# Patient Record
Sex: Male | Born: 1995 | Race: Black or African American | Hispanic: No | Marital: Single | Smoking: Never smoker
Health system: Southern US, Community
[De-identification: ages and names within clinical notes are randomized; demographics above are authoritative.]

## PROBLEM LIST (undated history)

## (undated) DIAGNOSIS — S62609A Fracture of unspecified phalanx of unspecified finger, initial encounter for closed fracture: Secondary | ICD-10-CM

## (undated) DIAGNOSIS — M419 Scoliosis, unspecified: Secondary | ICD-10-CM

## (undated) HISTORY — PX: KNEE SURGERY: SHX244

## (undated) HISTORY — PX: OTHER SURGICAL HISTORY: SHX169

---
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
## 2016-05-14 ENCOUNTER — Encounter (HOSPITAL_COMMUNITY): Payer: Self-pay | Admitting: Emergency Medicine

## 2016-05-14 ENCOUNTER — Emergency Department (HOSPITAL_COMMUNITY)
Admission: EM | Admit: 2016-05-14 | Discharge: 2016-05-14 | Disposition: A | Discharge status: Home / Self Care | Payer: Medicaid Other | Attending: Emergency Medicine | Admitting: Emergency Medicine

## 2016-05-14 DIAGNOSIS — Y999 Unspecified external cause status: Secondary | ICD-10-CM | POA: Diagnosis not present

## 2016-05-14 DIAGNOSIS — M546 Pain in thoracic spine: Secondary | ICD-10-CM | POA: Diagnosis present

## 2016-05-14 DIAGNOSIS — X500XXA Overexertion from strenuous movement or load, initial encounter: Secondary | ICD-10-CM | POA: Diagnosis not present

## 2016-05-14 DIAGNOSIS — M62830 Muscle spasm of back: Secondary | ICD-10-CM

## 2016-05-14 DIAGNOSIS — Y929 Unspecified place or not applicable: Secondary | ICD-10-CM | POA: Insufficient documentation

## 2016-05-14 DIAGNOSIS — Y939 Activity, unspecified: Secondary | ICD-10-CM | POA: Diagnosis not present

## 2016-05-14 DIAGNOSIS — M62838 Other muscle spasm: Secondary | ICD-10-CM | POA: Insufficient documentation

## 2016-05-14 HISTORY — DX: Scoliosis, unspecified: M41.9

## 2016-05-14 MED ORDER — KETOROLAC TROMETHAMINE 30 MG/ML IJ SOLN
30.0000 mg | Freq: Once | INTRAMUSCULAR | Status: AC
  Administered 2016-05-14: 30 mg via INTRAMUSCULAR
  Filled 2016-05-14: qty 1

## 2016-05-14 MED ORDER — CYCLOBENZAPRINE HCL 10 MG PO TABS: 10.0000 mg | ORAL_TABLET | Freq: Two times a day (BID) | ORAL | 0 refills | Status: DC | PRN

## 2016-05-14 NOTE — ED Triage Notes (Signed)
Pt stated that he was carrying a heavy box yesterday and felt a popping sensation in his upper back. Pain is currently in upper back and l/side

## 2016-05-14 NOTE — ED Provider Notes (Signed)
[facility]-EMERGENCY DEPT Provider Note   CSN: [id number] Arrival date & time: 05/14/16  1335  By signing my name below, I, Jonathan Rice, attest that this documentation has been prepared under the direction and in the presence of Jonathan Horsemanobert Vera Furniss, PA-C. Electronically Signed: Sonum Rice, Neurosurgeoncribe. 05/14/16. 2:16 PM.  History   Chief Complaint Chief Complaint  Patient presents with  . Back Pain   The history is provided by the patient. No language interpreter was used.     HPI Comments: Jonathan Rice is a 21 y.o. male who presents to the Emergency Department complaining of constant left sided mid back that began yesterday. He states he was carrying a heavy box when he felt a popping sensation in the affected area. He describes the pain as a sharp, stabbing sensation. He denies extremity numbness or weakness.     Past Medical History:  Diagnosis Date  . Broken finger   . Scoliosis     Patient Active Problem List   Diagnosis Date Noted  . Sports physical 02/19/2011    Past Surgical History:  Procedure Laterality Date  . KNEE SURGERY    . tumor     benign tumor r/thigh       Home Medications    Prior to Admission medications   Medication Sig Start Date End Date Taking? Authorizing Provider  cephALEXin (KEFLEX) 500 MG capsule 2 tabs po bid x 7 days 02/09/12   Viviano SimasLauren Robinson, NP  ibuprofen (ADVIL,MOTRIN) 400 MG tablet Take 1 tablet (400 mg total) by mouth every 6 (six) hours as needed for pain. 01/30/12   Ivonne AndrewPeter Dammen, PA-C    Family History Family History  Problem Relation Age of Onset  . Sudden death Neg Hx   . Heart attack Neg Hx     Social History Social History  Substance Use Topics  . Smoking status: Never Smoker  . Smokeless tobacco: Never Used  . Alcohol use No     Allergies   Patient has no known allergies.   Review of Systems Review of Systems  Musculoskeletal: Positive for back pain.  Neurological: Negative for weakness and numbness.      Physical Exam Updated Vital Signs BP 116/69 (BP Location: Left Arm)   Pulse 79   Temp 98.2 F (36.8 C) (Oral)   Wt 155 lb (70.3 kg)   SpO2 97%   Physical Exam  Constitutional: He is oriented to person, place, and time. He appears well-developed and well-nourished.  HENT:  Head: Normocephalic and atraumatic.  Cardiovascular: Normal rate.   Pulmonary/Chest: Effort normal.  Musculoskeletal: Normal range of motion. He exhibits tenderness. He exhibits no deformity.  Left trapezius and rhomboids tender to palpation with palpable trigger point. ROM and strength of upper extremities 5/5. No C, T, L, S spine tenderness, step offs, or deformity.   Neurological: He is alert and oriented to person, place, and time.  Skin: Skin is warm and dry.  Psychiatric: He has a normal mood and affect.  Nursing note and vitals reviewed.    ED Treatments / Results  DIAGNOSTIC STUDIES: Oxygen Saturation is 97% on RA, nml by my interpretation.    COORDINATION OF CARE: 2:17 PM Discussed treatment plan with pt at bedside and pt agreed to plan.   Labs (all labs ordered are listed, but only abnormal results are displayed) Labs Reviewed - No data to display  EKG  EKG Interpretation None       Radiology No results found.  Procedures Procedures (including critical  care time)  Medications Ordered in ED Medications - No data to display   Initial Impression / Assessment and Plan / ED Course  I have reviewed the triage vital signs and the nursing notes.  Pertinent labs & imaging results that were available during my care of the patient were reviewed by me and considered in my medical decision making (see chart for details).     Patient with back pain.  No neurological deficits and normal neuro exam.  Patient is ambulatory.  No loss of bowel or bladder control.  No concern for cauda equina.  No fever, night sweats, weight loss, h/o cancer, IVDA, no recent procedure to back. No urinary  symptoms suggestive of UTI.  Supportive care and return precaution discussed. Appears safe for discharge at this time. Follow up as indicated in discharge paperwork.    Final Clinical Impressions(s) / ED Diagnoses   Final diagnoses:  Muscle spasm of back    New Prescriptions New Prescriptions   No medications on file   I personally performed the services described in this documentation, which was scribed in my presence. The recorded information has been reviewed and is accurate.      Jonathan Horseman, PA-C 05/14/16 1438    Pricilla Loveless, MD 05/20/16 [phone]

## 2016-07-19 ENCOUNTER — Encounter (HOSPITAL_COMMUNITY): Payer: Self-pay | Admitting: Emergency Medicine

## 2016-07-19 ENCOUNTER — Emergency Department (HOSPITAL_COMMUNITY)
Admission: EM | Admit: 2016-07-19 | Discharge: 2016-07-19 | Disposition: A | Discharge status: Home / Self Care | Payer: Medicaid Other | Attending: Emergency Medicine | Admitting: Emergency Medicine

## 2016-07-19 DIAGNOSIS — Z79899 Other long term (current) drug therapy: Secondary | ICD-10-CM | POA: Insufficient documentation

## 2016-07-19 DIAGNOSIS — L02411 Cutaneous abscess of right axilla: Secondary | ICD-10-CM

## 2016-07-19 MED ORDER — LIDOCAINE-EPINEPHRINE (PF) 2 %-1:200000 IJ SOLN
INTRAMUSCULAR | Status: AC
  Administered 2016-07-19: 21:00:00
  Filled 2016-07-19: qty 20

## 2016-07-19 MED ORDER — LIDOCAINE-EPINEPHRINE 2 %-1:100000 IJ SOLN
20.0000 mL | Freq: Once | INTRAMUSCULAR | Status: DC
  Filled 2016-07-19: qty 20

## 2016-07-19 MED ORDER — CEPHALEXIN 500 MG PO CAPS: 500.0000 mg | ORAL_CAPSULE | Freq: Four times a day (QID) | ORAL | 0 refills | Status: AC

## 2016-07-19 NOTE — ED Provider Notes (Signed)
[facility]-EMERGENCY DEPT Provider Note   CSN: [id number] Arrival date & time: 07/19/16  1906  By signing my name below, I, Rosario AdieWilliam Andrew Hiatt, attest that this documentation has been prepared under the direction and in the presence of [place] Russo, PA-C.  Electronically Signed: Rosario AdieWilliam Andrew Hiatt, ED Scribe. 07/19/16. 8:15 PM.  History   Chief Complaint Chief Complaint  Patient presents with  . Abscess   The history is provided by the patient. No language interpreter was used.    HPI Comments: Jonathan Rice is a 21 y.o. male with no pertinent PMHx, who presents to the Emergency Department complaining of a moderate, gradually worsening area of pain and swelling to the right axillary region onset 2-3 days ago. No drainage from the area. No h/o similar symptoms or prior abscesses. Pt states pain is exacerbated with palpation and direct pressure. No recent changes in soaps, lotions, detergents. No recent illnesses/infections. He denies fever, chills, cough, congestion, sore throat, or any other associated symptoms.   Past Medical History:  Diagnosis Date  . Broken finger   . Scoliosis    Patient Active Problem List   Diagnosis Date Noted  . Sports physical 02/19/2011   Past Surgical History:  Procedure Laterality Date  . KNEE SURGERY    . tumor     benign tumor r/thigh    Home Medications    Prior to Admission medications   Medication Sig Start Date End Date Taking? Authorizing Provider  cephALEXin (KEFLEX) 500 MG capsule Take 1 capsule (500 mg total) by mouth 4 (four) times daily. 07/19/16 07/26/16  [place] Nicole Russo, PA-C  cyclobenzaprine (FLEXERIL) 10 MG tablet Take 1 tablet (10 mg total) by mouth 2 (two) times daily as needed for muscle spasms. 05/14/16   Roxy Horsemanobert Browning, PA-C  ibuprofen (ADVIL,MOTRIN) 400 MG tablet Take 1 tablet (400 mg total) by mouth every 6 (six) hours as needed for pain. 01/30/12   Ivonne AndrewPeter Dammen, PA-C   Family History Family History  Problem Relation Age  of Onset  . Sudden death Neg Hx   . Heart attack Neg Hx    Social History Social History  Substance Use Topics  . Smoking status: Never Smoker  . Smokeless tobacco: Never Used  . Alcohol use No   Allergies   Patient has no known allergies.  Review of Systems Review of Systems  Constitutional: Negative for chills and fever.  HENT: Negative for congestion and sore throat.   Respiratory: Negative for cough.   Musculoskeletal: Negative for myalgias.  Skin: Positive for wound (R axilla, painful).   Physical Exam Updated Vital Signs BP 134/75 (BP Location: Right Arm)   Pulse 77   Temp 98.5 F (36.9 C) (Oral)   Resp 20   Ht 5\' 9"  (1.753 m)   Wt 70.3 kg   SpO2 100%   BMI 22.89 kg/m   Physical Exam  Constitutional: He appears well-developed and well-nourished. No distress.  HENT:  Head: Normocephalic and atraumatic.  Eyes: Conjunctivae are normal.  Neck: Normal range of motion.  Cardiovascular: Normal rate.   Pulmonary/Chest: Effort normal.  Abdominal: He exhibits no distension.  Musculoskeletal: Normal range of motion.  Neurological: He is alert.  Skin: Skin is warm and dry. No pallor.  Right axilla has a 3cm fluctuant erythematous abscess. No purulent drainage. No significant surrounding erythema.   Psychiatric: He has a normal mood and affect. His behavior is normal.  Nursing note and vitals reviewed.  ED Treatments / Results  DIAGNOSTIC STUDIES:  Oxygen Saturation is 100% on RA, normal by my interpretation.   COORDINATION OF CARE: 8:15 PM-Discussed next steps with pt. Pt verbalized understanding and is agreeable with the plan.   Procedures .Marland KitchenIncision and Drainage Date/Time: 07/19/2016 8:17 PM Performed by: RUSSO, [place] NICOLE Authorized by: RUSSO, [place] NICOLE   Consent:    Consent obtained:  Verbal   Consent given by:  Patient   Risks discussed:  Bleeding, infection, incomplete drainage and pain   Alternatives discussed:  No treatment Universal  protocol:    Procedure explained and questions answered to patient or proxy's satisfaction: yes     Relevant documents present and verified: yes     Test results available and properly labeled: yes     Imaging studies available: yes     Required blood products, implants, devices, and special equipment available: yes     Site/side marked: yes     Immediately prior to procedure a time out was called: yes     Patient identity confirmed:  Verbally with patient, arm band and provided demographic data Location:    Type:  Abscess   Location:  Upper extremity   Upper extremity location:  Arm   Arm location: R axilla. Pre-procedure details:    Skin preparation:  Betadine Sedation:    Sedation type: none. Anesthesia (see MAR for exact dosages):    Anesthesia method:  Local infiltration   Local anesthetic:  Lidocaine 2% WITH epi Procedure type:    Complexity:  Simple Procedure details:    Incision types:  Single straight   Incision depth:  Dermal   Scalpel blade:  11   Wound management:  Probed and deloculated   Drainage:  Purulent and bloody   Drainage amount:  Moderate   Wound treatment:  Wound left open   Packing materials:  1/4 in iodoform gauze Post-procedure details:    Patient tolerance of procedure:  Tolerated well, no immediate complications      Medications Ordered in ED Medications  lidocaine-EPINEPHrine (XYLOCAINE W/EPI) 2 %-1:100000 (with pres) injection 20 mL (20 mLs Infiltration Not Given 07/19/16 2050)  lidocaine-EPINEPHrine (XYLOCAINE W/EPI) 2 %-1:200000 (PF) injection (  Given by Other 07/19/16 2050)    Initial Impression / Assessment and Plan / ED Course  I have reviewed the triage vital signs and the nursing notes.  Pertinent labs & imaging results that were available during my care of the patient were reviewed by me and considered in my medical decision making (see chart for details).     Pt is a 21 y.o. male who presents to the Emergency Department with  skin abscess amenable to incision and drainage. No signs of cellulitis surrounding skin. Will discharge home w Keflex.  Discussed results, findings, treatment and follow up. Patient advised of return precautions. Patient verbalized understanding and agreed with plan.   Final Clinical Impressions(s) / ED Diagnoses   Final diagnoses:  Abscess of axilla, right   New Prescriptions New Prescriptions   CEPHALEXIN (KEFLEX) 500 MG CAPSULE    Take 1 capsule (500 mg total) by mouth 4 (four) times daily.   I personally performed the services described in this documentation, which was scribed in my presence. The recorded information has been reviewed and is accurate.    [place] Nicole Russo, PA-C 07/19/16 2136    Rolland Porter, MD 07/28/16 1226

## 2016-07-19 NOTE — Discharge Instructions (Signed)
Please read instructions below. Talk with your primary care provider about any new medications. Follow up with your primary care doctor in 2-3 days for wound re-check and removal of wound packing. Return for ER for fever, or worsening symptoms.

## 2016-07-19 NOTE — ED Notes (Signed)
Using clean technique, covered abscess with telfa pad, gauze and secured with tape. Pt tolerated it well.

## 2016-07-19 NOTE — ED Triage Notes (Signed)
Pt here due to abscess under right axilla. Redness and swelling noted.

## 2016-07-22 ENCOUNTER — Emergency Department (HOSPITAL_COMMUNITY)
Admission: EM | Admit: 2016-07-22 | Discharge: 2016-07-22 | Disposition: A | Discharge status: Home / Self Care | Payer: Self-pay | Attending: Emergency Medicine | Admitting: Emergency Medicine

## 2016-07-22 DIAGNOSIS — Z5189 Encounter for other specified aftercare: Secondary | ICD-10-CM

## 2016-07-22 DIAGNOSIS — Z4801 Encounter for change or removal of surgical wound dressing: Secondary | ICD-10-CM | POA: Insufficient documentation

## 2016-07-22 NOTE — ED Provider Notes (Signed)
[facility]-EMERGENCY DEPT Provider Note   CSN: [id number] Arrival date & time: 07/22/16  1531     History   Chief Complaint Chief Complaint  Patient presents with  . Wound Check    HPI Jonathan Rice is a 21 y.o. male who presents today 3 days s/p I&D of right axilla for wound check. The pt tolerated the procedure well. Has not changed dressing since it was place 3 days ago. Denies bleeding, purrulence, or discomfort. Denies fever/chills, CP/SOB, or pain at this time. Endorses mild soreness at the site. States he is taking the abx he was given as prescribed.   The history is provided by the patient.    Past Medical History:  Diagnosis Date  . Broken finger   . Scoliosis     Patient Active Problem List   Diagnosis Date Noted  . Sports physical 02/19/2011    Past Surgical History:  Procedure Laterality Date  . KNEE SURGERY    . tumor     benign tumor r/thigh       Home Medications    Prior to Admission medications   Medication Sig Start Date End Date Taking? Authorizing Provider  cephALEXin (KEFLEX) 500 MG capsule Take 1 capsule (500 mg total) by mouth 4 (four) times daily. 07/19/16 07/26/16  [place] N Russo, PA-C  cyclobenzaprine (FLEXERIL) 10 MG tablet Take 1 tablet (10 mg total) by mouth 2 (two) times daily as needed for muscle spasms. 05/14/16   Roxy Horsemanobert Browning, PA-C  ibuprofen (ADVIL,MOTRIN) 400 MG tablet Take 1 tablet (400 mg total) by mouth every 6 (six) hours as needed for pain. 01/30/12   Ivonne AndrewPeter Dammen, PA-C    Family History Family History  Problem Relation Age of Onset  . Sudden death Neg Hx   . Heart attack Neg Hx     Social History Social History  Substance Use Topics  . Smoking status: Never Smoker  . Smokeless tobacco: Never Used  . Alcohol use No     Allergies   Patient has no known allergies.   Review of Systems Review of Systems  Constitutional: Negative for chills and fever.  Respiratory: Negative for cough.   Cardiovascular: Negative for  chest pain.  Gastrointestinal: Negative for abdominal pain, diarrhea, nausea and vomiting.  Musculoskeletal: Negative for arthralgias and back pain.  Skin: Negative for rash.     Physical Exam Updated Vital Signs BP (!) 119/55 (BP Location: Right Arm)   Pulse 61   Temp 99 F (37.2 C) (Oral)   Resp 18   SpO2 98%   Physical Exam  Constitutional: He is oriented to person, place, and time. He appears well-developed and well-nourished. No distress.  HENT:  Head: Normocephalic and atraumatic.  Eyes: EOM are normal. Right eye exhibits no discharge. Left eye exhibits no discharge. No scleral icterus.  Neck: Neck supple. No JVD present. No tracheal deviation present.  Cardiovascular: Normal rate.   Pulmonary/Chest: Effort normal.  Abdominal: He exhibits no distension.  Musculoskeletal: Normal range of motion.  Neurological: He is alert and oriented to person, place, and time.  Skin: Skin is warm and dry. He is not diaphoretic.  <1cm well-healing incision on the L axilla, no drainage, purulence, fluctuance, surrounding erythema, mild ttp.   Psychiatric: He has a normal mood and affect. His behavior is normal. Judgment and thought content normal.     ED Treatments / Results  Labs (all labs ordered are listed, but only abnormal results are displayed) Labs Reviewed - No data  to display  EKG  EKG Interpretation None       Radiology No results found.  Procedures Procedures (including critical care time)  Medications Ordered in ED Medications - No data to display   Initial Impression / Assessment and Plan / ED Course  I have reviewed the triage vital signs and the nursing notes.  Pertinent labs & imaging results that were available during my care of the patient were reviewed by me and considered in my medical decision making (see chart for details).     20yom 3 days s/p I&D left axilla presents for wound check. No complications, pt taking abx as prescribed. Appears to be  well-healing, no concern for infection or bleeding. Discussed completing abx course and use of warm compresses for comfort. Discussed ED return precautions.  Pt verbalized understanding and agrees with this plan.   Final Clinical Impressions(s) / ED Diagnoses   Final diagnoses:  Visit for wound check    New Prescriptions Discharge Medication List as of 07/22/2016  4:07 PM       Jeanie Sewer, PA 07/22/16 1630    Lavera Guise, MD 07/23/16 1254

## 2016-07-22 NOTE — ED Triage Notes (Addendum)
Pt here for wound check. Pt had right underarm abscess drained 2 days ago.

## 2016-12-17 ENCOUNTER — Encounter (HOSPITAL_COMMUNITY): Payer: Self-pay | Admitting: *Deleted

## 2016-12-17 ENCOUNTER — Emergency Department (HOSPITAL_COMMUNITY)
Admission: EM | Admit: 2016-12-17 | Discharge: 2016-12-17 | Disposition: A | Discharge status: Home / Self Care | Payer: Self-pay | Attending: Emergency Medicine | Admitting: Emergency Medicine

## 2016-12-17 DIAGNOSIS — W25XXXA Contact with sharp glass, initial encounter: Secondary | ICD-10-CM | POA: Insufficient documentation

## 2016-12-17 DIAGNOSIS — Y9389 Activity, other specified: Secondary | ICD-10-CM | POA: Insufficient documentation

## 2016-12-17 DIAGNOSIS — S61216A Laceration without foreign body of right little finger without damage to nail, initial encounter: Secondary | ICD-10-CM | POA: Insufficient documentation

## 2016-12-17 DIAGNOSIS — M545 Low back pain, unspecified: Secondary | ICD-10-CM

## 2016-12-17 DIAGNOSIS — Y929 Unspecified place or not applicable: Secondary | ICD-10-CM | POA: Insufficient documentation

## 2016-12-17 DIAGNOSIS — Y998 Other external cause status: Secondary | ICD-10-CM | POA: Insufficient documentation

## 2016-12-17 DIAGNOSIS — G8929 Other chronic pain: Secondary | ICD-10-CM | POA: Insufficient documentation

## 2016-12-17 MED ORDER — METHOCARBAMOL 500 MG PO TABS
500.0000 mg | ORAL_TABLET | Freq: Once | ORAL | Status: AC
  Administered 2016-12-17: 500 mg via ORAL
  Filled 2016-12-17: qty 1

## 2016-12-17 MED ORDER — NAPROXEN 250 MG PO TABS
500.0000 mg | ORAL_TABLET | Freq: Once | ORAL | Status: AC
  Administered 2016-12-17: 500 mg via ORAL
  Filled 2016-12-17: qty 2

## 2016-12-17 MED ORDER — METHOCARBAMOL 500 MG PO TABS: 500.0000 mg | ORAL_TABLET | Freq: Every evening | ORAL | 0 refills | Status: DC | PRN

## 2016-12-17 MED ORDER — NAPROXEN 500 MG PO TABS: 500.0000 mg | ORAL_TABLET | Freq: Two times a day (BID) | ORAL | 0 refills | Status: DC

## 2016-12-17 NOTE — ED Notes (Signed)
Patient transported to CT 

## 2016-12-17 NOTE — ED Provider Notes (Signed)
[facility]-EMERGENCY DEPT Provider Note   CSN: [id number] Arrival date & time: 12/17/16  1801     History   Chief Complaint Chief Complaint  Patient presents with  . Laceration    HPI Jonathan Rice is a 21 y.o. male with past medical history significant for scoliosis presenting with right small finger injury while cleaning his car and cutting his finger on some broken glass debris. No bleeding. No loss of sensation, no other injuries. Patient also mentions that he has been having chronic low back pain and was given ibuprofen in the past but it didn't work. He has also tried ice without relief. Denies loss of bowel or bladder function, numbness, weakness, fevers, chills or other symptoms.  HPI  Past Medical History:  Diagnosis Date  . Broken finger   . Scoliosis     Patient Active Problem List   Diagnosis Date Noted  . Sports physical 02/19/2011    Past Surgical History:  Procedure Laterality Date  . KNEE SURGERY    . tumor     benign tumor r/thigh       Home Medications    Prior to Admission medications   Medication Sig Start Date End Date Taking? Authorizing Provider  cyclobenzaprine (FLEXERIL) 10 MG tablet Take 1 tablet (10 mg total) by mouth 2 (two) times daily as needed for muscle spasms. 05/14/16   Roxy Horseman, PA-C  ibuprofen (ADVIL,MOTRIN) 400 MG tablet Take 1 tablet (400 mg total) by mouth every 6 (six) hours as needed for pain. 01/30/12   Ivonne Andrew, PA-C    Family History Family History  Problem Relation Age of Onset  . Sudden death Neg Hx   . Heart attack Neg Hx     Social History Social History  Substance Use Topics  . Smoking status: Never Smoker  . Smokeless tobacco: Never Used  . Alcohol use No     Allergies   Patient has no known allergies.   Review of Systems Review of Systems  Constitutional: Negative for chills and fever.  Respiratory: Negative for cough, shortness of breath, wheezing and stridor.   Cardiovascular: Negative  for chest pain and palpitations.  Gastrointestinal: Negative for abdominal distention, abdominal pain, nausea and vomiting.  Genitourinary: Negative for difficulty urinating, dysuria and hematuria.  Musculoskeletal: Positive for back pain and myalgias. Negative for arthralgias, gait problem, joint swelling, neck pain and neck stiffness.  Skin: Positive for wound. Negative for color change, pallor and rash.  Neurological: Negative for dizziness, seizures, syncope, weakness, light-headedness, numbness and headaches.     Physical Exam Updated Vital Signs BP 129/80 (BP Location: Left Arm)   Pulse 61   Temp 98.2 F (36.8 C) (Oral)   Resp 18   SpO2 98%   Physical Exam  Constitutional: He appears well-developed and well-nourished. No distress.  Afebrile, nontoxic-appearing, sitting comfortably in chair in no acute distress.  HENT:  Head: Normocephalic and atraumatic.  Eyes: Conjunctivae are normal.  Neck: Neck supple.  Cardiovascular: Normal rate, regular rhythm and normal heart sounds.   No murmur heard. Pulmonary/Chest: Effort normal and breath sounds normal. No respiratory distress. He has no wheezes. He has no rales.  Musculoskeletal: Normal range of motion. He exhibits no edema or tenderness.  No midline tenderness palpation of the spine, some tightness and discomfort in the lower lumbar musculature. Full range of motion of all fingers, brisk cap refills sensation intact  Neurological: He is alert. No sensory deficit. He exhibits normal muscle tone.  5 out  of 5 strength in lower extremities bilaterally. Normal stance and gait. Neurovascularly intact in lower extremities bilaterally.  5 out of 5 strength to flexion and extension of all fingers  Skin: Skin is warm and dry. No rash noted. He is not diaphoretic. No erythema. No pallor.  2 mm stellate superficial laceration to the dorsal aspect of the right little finger between the PIP and DIP. No bleeding, Band-Aid applied in triage.    Psychiatric: He has a normal mood and affect.  Nursing note and vitals reviewed.    ED Treatments / Results  Labs (all labs ordered are listed, but only abnormal results are displayed) Labs Reviewed - No data to display  EKG  EKG Interpretation None       Radiology No results found.  Procedures Procedures (including critical care time)  LACERATION REPAIR Performed by: Georgiana Shore Authorized by: Georgiana Shore Consent: Verbal consent obtained. Risks and benefits: risks, benefits and alternatives were discussed Consent given by: patient Patient identity confirmed: provided demographic data Prepped and Draped in normal sterile fashion Wound explored  Laceration Location: right little finger, dorsal, between the PIP and DIP  Laceration Length: 0.3 cm  No Foreign Bodies seen or palpated  Anesthesia: none  Local anesthetic:N/day  Anesthetic total: N/A  Irrigation method: syringe Amount of cleaning: standard  Skin closure: Dermabond  Number of sutures: none  Technique: adhesive  Patient tolerance: Patient tolerated the procedure well with no immediate complications.   Medications Ordered in ED Medications  naproxen (NAPROSYN) tablet 500 mg (not administered)  methocarbamol (ROBAXIN) tablet 500 mg (not administered)     Initial Impression / Assessment and Plan / ED Course  I have reviewed the triage vital signs and the nursing notes.  Pertinent labs & imaging results that were available during my care of the patient were reviewed by me and considered in my medical decision making (see chart for details).     Patient with small superficial laceration to right little finger. No need for sutures. Repaired with Dermabond.  Patient also presents with chronic lower back pain.  No gross neurological deficits and normal neuro exam.  Patient has no gait abnormality or concern for cauda equina.  No loss of bowel or bladder control, fever, night  sweats, weight loss, h/o malignancy, or IVDU.  RICE protocol and pain medications indicated and discussed with patient.    Pressure irrigation performed. Wound explored and base of wound visualized in a bloodless field without evidence of foreign body.  Laceration occurred < 8 hours prior to repair which was well tolerated. Tdap up to date.  Pt has no comorbidities to effect normal wound healing. Pt discharged without antibiotics.    Discussed adhesive closure home care with patient and answered questions. Pt to follow-up for wound check as needed; they are to return to the ED sooner for signs of infection. Pt is hemodynamically stable with no complaints prior to dc.   Discussed strict return precautions and advised to return to the emergency department if experiencing any new or worsening symptoms. Instructions were understood and patient agreed with discharge plan.  Final Clinical Impressions(s) / ED Diagnoses   Final diagnoses:  Laceration of right little finger without foreign body without damage to nail, initial encounter  Chronic bilateral low back pain without sciatica    New Prescriptions New Prescriptions   No medications on file     Gregary Cromer 12/17/16 2218    Tegeler, Canary Brim, MD 12/17/16 2239

## 2016-12-17 NOTE — Discharge Instructions (Signed)
As discussed, the medication prescribed cannot be taken while driving, operating machinery or with alcohol. Take this muscle relaxant at nighttime as needed for muscle spasm. Naproxen twice a day with food. Apply heat pads to your back for comfort.  Follow-up with your primary care provider for management of chronic back pain and related scoliosis.  Return to the ER if you experience loss of sensation in extremity, weakness, loss of bowel or bladder function, fever/chills, night sweats or other concerning symptoms in the meantime.  Monitor for any signs of infection appear little finger and return if you experience swelling, redness, warmth, increased pain. Keep the area clean and dry for at least 24 hours.

## 2016-12-17 NOTE — ED Triage Notes (Signed)
Pt cut his right pinkie on broken glass.  Bleeding controlled and bandaid applied at triage

## 2017-04-29 ENCOUNTER — Other Ambulatory Visit: Payer: Self-pay

## 2017-04-29 ENCOUNTER — Encounter (HOSPITAL_COMMUNITY): Payer: Self-pay

## 2017-04-29 ENCOUNTER — Emergency Department (HOSPITAL_COMMUNITY)
Admission: EM | Admit: 2017-04-29 | Discharge: 2017-04-29 | Disposition: A | Discharge status: Home / Self Care | Payer: Self-pay | Attending: Emergency Medicine | Admitting: Emergency Medicine

## 2017-04-29 DIAGNOSIS — L089 Local infection of the skin and subcutaneous tissue, unspecified: Secondary | ICD-10-CM | POA: Insufficient documentation

## 2017-04-29 DIAGNOSIS — Z5321 Procedure and treatment not carried out due to patient leaving prior to being seen by health care provider: Secondary | ICD-10-CM | POA: Insufficient documentation

## 2017-04-29 NOTE — ED Triage Notes (Signed)
Pt complaining of abscess in L groin. He reports that it is draining white fluid. No fevers. Hx of abscesses. A&Ox4.

## 2017-05-31 ENCOUNTER — Emergency Department (HOSPITAL_COMMUNITY)
Admission: EM | Admit: 2017-05-31 | Discharge: 2017-05-31 | Disposition: A | Discharge status: Home / Self Care | Payer: Self-pay | Attending: Physician Assistant | Admitting: Physician Assistant

## 2017-05-31 ENCOUNTER — Other Ambulatory Visit: Payer: Self-pay

## 2017-05-31 ENCOUNTER — Encounter (HOSPITAL_COMMUNITY): Payer: Self-pay

## 2017-05-31 DIAGNOSIS — Z79899 Other long term (current) drug therapy: Secondary | ICD-10-CM | POA: Insufficient documentation

## 2017-05-31 DIAGNOSIS — L02214 Cutaneous abscess of groin: Secondary | ICD-10-CM | POA: Insufficient documentation

## 2017-05-31 DIAGNOSIS — L0291 Cutaneous abscess, unspecified: Secondary | ICD-10-CM

## 2017-05-31 MED ORDER — ACYCLOVIR 400 MG PO TABS: 400.0000 mg | ORAL_TABLET | Freq: Three times a day (TID) | ORAL | 0 refills | Status: DC

## 2017-05-31 MED ORDER — SULFAMETHOXAZOLE-TRIMETHOPRIM 800-160 MG PO TABS: 1.0000 | ORAL_TABLET | Freq: Two times a day (BID) | ORAL | 0 refills | Status: AC

## 2017-05-31 NOTE — ED Provider Notes (Signed)
[facility] Provider Note   CSN: [id number] Arrival date & time: 05/31/17  1458     History   Chief Complaint Chief Complaint  Patient presents with  . Recurrent Skin Infections    HPI Jonathan Rice is a 22 y.o. male.  HPI   22 year old male presents today with swelling to his right groin.  Patient notes that approximately 3 days ago he developed soreness and swelling to the right groin with a area of hardness.  He notes this feels similar to previous abscesses that he has had in his armpit.  He denies any fever chills nausea or vomiting.  Patient denies any rashes, or itchiness.  She reports he is sexually active.  He denies any urinary complaints.  Past Medical History:  Diagnosis Date  . Broken finger   . Scoliosis     Patient Active Problem List   Diagnosis Date Noted  . Sports physical 02/19/2011    Past Surgical History:  Procedure Laterality Date  . KNEE SURGERY    . tumor     benign tumor r/thigh       Home Medications    Prior to Admission medications   Medication Sig Start Date End Date Taking? Authorizing Provider  acyclovir (ZOVIRAX) 400 MG tablet Take 1 tablet (400 mg total) by mouth 3 (three) times daily. 05/31/17   Joshuajames Moehring, Tinnie GensJeffrey, PA-C  cyclobenzaprine (FLEXERIL) 10 MG tablet Take 1 tablet (10 mg total) by mouth 2 (two) times daily as needed for muscle spasms. 05/14/16   Roxy HorsemanBrowning, Robert, PA-C  ibuprofen (ADVIL,MOTRIN) 400 MG tablet Take 1 tablet (400 mg total) by mouth every 6 (six) hours as needed for pain. 01/30/12   Ivonne Andrewammen, Peter, PA-C  methocarbamol (ROBAXIN) 500 MG tablet Take 1 tablet (500 mg total) by mouth at bedtime as needed for muscle spasms. 12/17/16   Mathews RobinsonsMitchell, Jessica B, PA-C  naproxen (NAPROSYN) 500 MG tablet Take 1 tablet (500 mg total) by mouth 2 (two) times daily with a meal. 12/17/16   Mathews RobinsonsMitchell, Jessica B, PA-C  sulfamethoxazole-trimethoprim (BACTRIM DS,SEPTRA DS) 800-160 MG tablet Take 1 tablet  by mouth 2 (two) times daily for 7 days. 05/31/17 06/07/17  Eyvonne MechanicHedges, Tasheema Perrone, PA-C    Family History Family History  Problem Relation Age of Onset  . Sudden death Neg Hx   . Heart attack Neg Hx     Social History Social History   Tobacco Use  . Smoking status: Never Smoker  . Smokeless tobacco: Never Used  Substance Use Topics  . Alcohol use: No  . Drug use: No     Allergies   Patient has no known allergies.   Review of Systems Review of Systems  All other systems reviewed and are negative.    Physical Exam Updated Vital Signs BP (!) 141/55 (BP Location: Right Arm)   Pulse (!) 57   Temp 98.1 F (36.7 C) (Oral)   Resp 18   SpO2 100%   Physical Exam  Constitutional: He is oriented to person, place, and time. He appears well-developed and well-nourished.  HENT:  Head: Normocephalic and atraumatic.  Eyes: Conjunctivae are normal. Pupils are equal, round, and reactive to light. Right eye exhibits no discharge. Left eye exhibits no discharge. No scleral icterus.  Neck: Normal range of motion. No JVD present. No tracheal deviation present.  Pulmonary/Chest: Effort normal. No stridor.  Genitourinary:  Genitourinary Comments: Small area of induration along the right inguinal region no fluctuance noted, several areas that  appear to be vesicular in nature  Neurological: He is alert and oriented to person, place, and time. Coordination normal.  Psychiatric: He has a normal mood and affect. His behavior is normal. Judgment and thought content normal.  Nursing note and vitals reviewed.    ED Treatments / Results  Labs (all labs ordered are listed, but only abnormal results are displayed) Labs Reviewed - No data to display  EKG  EKG Interpretation None       Radiology No results found.  Procedures Procedures (including critical care time)  Medications Ordered in ED Medications - No data to display   Initial Impression / Assessment and Plan / ED Course  I  have reviewed the triage vital signs and the nursing notes.  Pertinent labs & imaging results that were available during my care of the patient were reviewed by me and considered in my medical decision making (see chart for details).      Final Clinical Impressions(s) / ED Diagnoses   Final diagnoses:  Abscess    Labs:   Imaging:  Consults:  Therapeutics:  Discharge Meds: Bactrim, Cyclovir  Assessment/Plan: 22 year old male presents today with swelling to his right groin.  He does have an area of induration.  He has what appears to be a rash with potentially developing vesicles over the induration but due to hair and skin pigmentation is difficult to assess this.  Patient denies any history of the same.  He has no appreciable abscess.  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  Patient will return immediately with any new or worsening signs or symptoms.  He verbalized understanding and agreement to this plan had no further questions or concerns.      ED Discharge Orders        Ordered    sulfamethoxazole-trimethoprim (BACTRIM DS,SEPTRA DS) 800-160 MG tablet  2 times daily     05/31/17 1651    acyclovir (ZOVIRAX) 400 MG tablet  3 times daily     05/31/17 1654       Eyvonne Mechanic, PA-C 05/31/17 1655    Mackuen, Cindee Salt, MD 06/02/17 1452

## 2017-05-31 NOTE — ED Notes (Signed)
Pt verbalized understanding of medication use and has no further questions. VSS, NAD.

## 2017-05-31 NOTE — ED Triage Notes (Signed)
Pt states he has an abscess to his right groin area. Pt states that it has been there 2 days. No hx of same. No hx of diabetes. Pt afebrile.

## 2017-05-31 NOTE — Discharge Instructions (Signed)
Please read attached information. If you experience any new or worsening signs or symptoms please return to the emergency room for evaluation. Please follow-up with your primary care provider or specialist as discussed. Please use medication prescribed only as directed and discontinue taking if you have any concerning signs or symptoms.   °

## 2017-11-29 ENCOUNTER — Other Ambulatory Visit: Payer: Self-pay

## 2017-11-29 ENCOUNTER — Encounter (HOSPITAL_COMMUNITY): Payer: Self-pay | Admitting: Emergency Medicine

## 2017-11-29 ENCOUNTER — Emergency Department (HOSPITAL_COMMUNITY): Payer: Medicaid Other

## 2017-11-29 ENCOUNTER — Emergency Department (HOSPITAL_COMMUNITY)
Admission: EM | Admit: 2017-11-29 | Discharge: 2017-11-29 | Disposition: A | Discharge status: Home / Self Care | Payer: Medicaid Other | Attending: Emergency Medicine | Admitting: Emergency Medicine

## 2017-11-29 DIAGNOSIS — R0789 Other chest pain: Secondary | ICD-10-CM | POA: Insufficient documentation

## 2017-11-29 DIAGNOSIS — Z79899 Other long term (current) drug therapy: Secondary | ICD-10-CM | POA: Insufficient documentation

## 2017-11-29 LAB — CBC
HCT: 43.7 % (ref 39.0–52.0)
Hemoglobin: 13.8 g/dL (ref 13.0–17.0)
MCH: 27.1 pg (ref 26.0–34.0)
MCHC: 31.6 g/dL (ref 30.0–36.0)
MCV: 85.7 fL (ref 78.0–100.0)
PLATELETS: 314 10*3/uL (ref 150–400)
RBC: 5.1 MIL/uL (ref 4.22–5.81)
RDW: 13.1 % (ref 11.5–15.5)
WBC: 7.4 10*3/uL (ref 4.0–10.5)

## 2017-11-29 LAB — BASIC METABOLIC PANEL
Anion gap: 9 (ref 5–15)
BUN: 9 mg/dL (ref 6–20)
CHLORIDE: 102 mmol/L (ref 98–111)
CO2: 27 mmol/L (ref 22–32)
CREATININE: 1.28 mg/dL — AB (ref 0.61–1.24)
Calcium: 9.1 mg/dL (ref 8.9–10.3)
GFR calc non Af Amer: >60 mL/min (ref >60)
GLUCOSE: 101 mg/dL — AB (ref 70–99)
Potassium: 3.6 mmol/L (ref 3.5–5.1)
SODIUM: 138 mmol/L (ref 135–145)

## 2017-11-29 LAB — I-STAT TROPONIN, ED: Troponin i, poc: 0 ng/mL (ref 0.00–0.08)

## 2017-11-29 MED ORDER — NAPROXEN 500 MG PO TABS: 500.0000 mg | ORAL_TABLET | Freq: Two times a day (BID) | ORAL | 0 refills | Status: DC

## 2017-11-29 NOTE — ED Provider Notes (Signed)
[facility]   CSN: [id number] Arrival date & time: 11/29/17  0214     History   Chief Complaint Chief Complaint  Patient presents with  . Chest Pain    HPI Jonathan Rice is a 22 y.o. male.  HPI Jonathan FlakesJames J Rice is a 22 y.o. male presents to emergency department complaining of right-sided chest pain.  Patient states he was helping his mom move a freezer and remembers feeling sharp pain in the right chest and ribs.  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  He denies any associated shortness of breath.  No dizziness or lightheadedness.  No nausea or vomiting.  No pain or swelling in extremities.  No recent travel.  No recent surgeries.  He states pain is worse when lifting boxes and moving right arm.  It does not radiate.  Did not take anything prior to coming in.  Past Medical History:  Diagnosis Date  . Broken finger   . Scoliosis     Patient Active Problem List   Diagnosis Date Noted  . Sports physical 02/19/2011    Past Surgical History:  Procedure Laterality Date  . KNEE SURGERY    . tumor     benign tumor r/thigh        Home Medications    Prior to Admission medications   Medication Sig Start Date End Date Taking? Authorizing Provider  ibuprofen (ADVIL,MOTRIN) 400 MG tablet Take 1 tablet (400 mg total) by mouth every 6 (six) hours as needed for pain. 01/30/12  Yes Dammen, Theron AristaPeter, PA-C  acyclovir (ZOVIRAX) 400 MG tablet Take 1 tablet (400 mg total) by mouth 3 (three) times daily. Patient not taking: Reported on 11/29/2017 05/31/17   Eyvonne MechanicHedges, Jeffrey, PA-C  cyclobenzaprine (FLEXERIL) 10 MG tablet Take 1 tablet (10 mg total) by mouth 2 (two) times daily as needed for muscle spasms. Patient not taking: Reported on 11/29/2017 05/14/16   Roxy HorsemanBrowning, Robert, PA-C  methocarbamol (ROBAXIN) 500 MG tablet Take 1 tablet (500 mg  total) by mouth at bedtime as needed for muscle spasms. Patient not taking: Reported on 11/29/2017 12/17/16   Mathews RobinsonsMitchell, Jessica B, PA-C  naproxen (NAPROSYN) 500 MG tablet Take 1 tablet (500 mg total) by mouth 2 (two) times daily with a meal. Patient not taking: Reported on 11/29/2017 12/17/16   Gregary CromerMitchell, Jessica B, PA-C    Family History Family History  Problem Relation Age of Onset  . Sudden death Neg Hx   . Heart attack Neg Hx     Social History Social History   Tobacco Use  . Smoking status: Never Smoker  . Smokeless tobacco: Never Used  Substance Use Topics  . Alcohol use: No  . Drug use: No     Allergies   Patient has no known allergies.   Review of Systems Review of Systems  Constitutional: Negative for chills and fever.  Respiratory: Positive for chest tightness. Negative for cough and shortness of breath.   Cardiovascular: Positive for chest pain. Negative for palpitations and leg swelling.  Gastrointestinal: Negative for abdominal distention, abdominal pain, diarrhea, nausea and vomiting.  Musculoskeletal: Negative for arthralgias, myalgias, neck pain and neck stiffness.  Skin: Negative for rash.  Allergic/Immunologic: Negative for immunocompromised state.  Neurological: Negative for dizziness, weakness, light-headedness, numbness and headaches.  All other systems reviewed and are negative.  Physical Exam Updated Vital Signs BP 112/67   Pulse (!) 54   Temp 98.4 F (36.9 C) (Oral)   Resp 15   Ht 5\' 10"  (1.778 m)   Wt 72.6 kg (160 lb)   SpO2 100%   BMI 22.96 kg/m   Physical Exam  Constitutional: He is oriented to person, place, and time. He appears well-developed and well-nourished. No distress.  HENT:  Head: Normocephalic and atraumatic.  Eyes: Conjunctivae are normal.  Neck: Neck supple.  Cardiovascular: Normal rate, regular rhythm and normal heart sounds.  Pulmonary/Chest: Effort normal. No respiratory distress. He has no wheezes. He has no rales.  He exhibits tenderness.  ttp over right pectoral muscle. Pain with rom of right shoulder.   Abdominal: Soft. Bowel sounds are normal. He exhibits no distension. There is no tenderness. There is no rebound.  Musculoskeletal: He exhibits no edema.  Neurological: He is alert and oriented to person, place, and time.  Skin: Skin is warm and dry.  Nursing note and vitals reviewed.    ED Treatments / Results  Labs (all labs ordered are listed, but only abnormal results are displayed) Labs Reviewed  BASIC METABOLIC PANEL - Abnormal; Notable for the following components:      Result Value   Glucose, Bld 101 (*)    Creatinine, Ser 1.28 (*)    All other components within normal limits  CBC  I-STAT TROPONIN, ED    EKG ED ECG REPORT   Date: 11/29/2017  Rate: 69  Rhythm: normal sinus rhythm  QRS Axis: normal  Intervals: normal  ST/T Wave abnormalities: normal  Conduction Disutrbances:none  Narrative Interpretation:   Old EKG Reviewed: none available  I have personally reviewed the EKG tracing and agree with the computerized printout as noted.  Radiology Dg Chest 2 View  Result Date: 11/29/2017 CLINICAL DATA:  Right upper chest pain this evening with exertion. EXAM: CHEST - 2 VIEW COMPARISON:  None. FINDINGS: The heart size and mediastinal contours are within normal limits. Both lungs are clear. The visualized skeletal structures are unremarkable. IMPRESSION: No active cardiopulmonary disease. Electronically Signed   By: Tollie Eth M.D.   On: 11/29/2017 03:08    Procedures Procedures (including critical care time)  Medications Ordered in ED Medications - No data to display   Initial Impression / Assessment and Plan / ED Course  I have reviewed the triage vital signs and the nursing notes.  Pertinent labs & imaging results that were available during my care of the patient were reviewed by me and considered in my medical decision making (see chart for details).     Patient in  emergency department with right-sided chest wall pain that is reproducible with palpation and with movement.  Injured it while lifting a heavy freezer.  No associated shortness of breath, dizziness, lightheadedness.  No swelling or pain in extremities.  Vital signs are normal.  Patient is PERC negative, doubt PE.  No risk factors for ACS.  Negative troponin and EKG.  Chest x-ray normal.  I think that pain is musculoskeletal.  Will treat with NSAIDs.  Follow-up with primary care doctor.  Vitals:   11/29/17 0222 11/29/17 0225 11/29/17 0530  BP:  117/66 112/67  Pulse:  69 (!) 54  Resp:  17 15  Temp:  98.4 F (36.9 C)   TempSrc:  Oral   SpO2:  100% 100%  Weight: 72.6 kg (160 lb)    Height: 5\' 10"  (1.778 m)  Final Clinical Impressions(s) / ED Diagnoses   Final diagnoses:  Chest wall pain    ED Discharge Orders        Ordered    naproxen (NAPROSYN) 500 MG tablet  2 times daily     11/29/17 0657       Jaynie Crumble, PA-C 11/29/17 1191    Palumbo, April, MD 11/29/17 [phone]

## 2017-11-29 NOTE — Discharge Instructions (Addendum)
Naprosyn for pain and inflammation. Avoid heavy lifting. Follow up with family doctor.

## 2017-11-29 NOTE — ED Triage Notes (Signed)
Patient reports right upper chest pain this evening worse with exertion , denies SOB , no cough or congestions , pt. suspects pain stems from muscle strain while helping her mother move a heavy freezer.

## 2018-01-10 ENCOUNTER — Encounter (HOSPITAL_COMMUNITY): Payer: Self-pay | Admitting: Emergency Medicine

## 2018-01-10 ENCOUNTER — Emergency Department (HOSPITAL_COMMUNITY)
Admission: EM | Admit: 2018-01-10 | Discharge: 2018-01-10 | Disposition: A | Discharge status: Home / Self Care | Payer: Medicaid Other | Attending: Emergency Medicine | Admitting: Emergency Medicine

## 2018-01-10 DIAGNOSIS — Y999 Unspecified external cause status: Secondary | ICD-10-CM | POA: Insufficient documentation

## 2018-01-10 DIAGNOSIS — S39012A Strain of muscle, fascia and tendon of lower back, initial encounter: Secondary | ICD-10-CM | POA: Insufficient documentation

## 2018-01-10 DIAGNOSIS — Z79899 Other long term (current) drug therapy: Secondary | ICD-10-CM | POA: Insufficient documentation

## 2018-01-10 DIAGNOSIS — Y92410 Unspecified street and highway as the place of occurrence of the external cause: Secondary | ICD-10-CM | POA: Insufficient documentation

## 2018-01-10 DIAGNOSIS — Y9389 Activity, other specified: Secondary | ICD-10-CM | POA: Insufficient documentation

## 2018-01-10 MED ORDER — CYCLOBENZAPRINE HCL 10 MG PO TABS: 10.0000 mg | ORAL_TABLET | Freq: Two times a day (BID) | ORAL | 0 refills | Status: DC | PRN

## 2018-01-10 MED ORDER — OXYCODONE-ACETAMINOPHEN 5-325 MG PO TABS
1.0000 | ORAL_TABLET | Freq: Once | ORAL | Status: AC
  Administered 2018-01-10: 1 via ORAL
  Filled 2018-01-10: qty 1
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
## 2018-02-12 ENCOUNTER — Emergency Department (HOSPITAL_COMMUNITY)
Admission: EM | Admit: 2018-02-12 | Discharge: 2018-02-12 | Disposition: A | Discharge status: Home / Self Care | Payer: Medicaid Other | Attending: Emergency Medicine | Admitting: Emergency Medicine

## 2018-02-12 ENCOUNTER — Encounter (HOSPITAL_COMMUNITY): Payer: Self-pay | Admitting: Emergency Medicine

## 2018-02-12 DIAGNOSIS — H6121 Impacted cerumen, right ear: Secondary | ICD-10-CM | POA: Insufficient documentation

## 2018-02-12 NOTE — ED Provider Notes (Signed)
[facility]   CSN: [id number] Arrival date & time: 02/12/18  4098     History   Chief Complaint Chief Complaint  Patient presents with  . Ear Pain    HPI Jonathan Rice is a 22 y.o. male.  The history is provided by the patient.  Otalgia  This is a new problem. The current episode started 3 to 5 hours ago. There is pain in the right ear. The problem occurs constantly. The problem has not changed since onset.There has been no fever. The fever has been present for less than 1 day. The pain is at a severity of 2/10. The pain is mild. Pertinent negatives include no ear discharge, no headaches, no hearing loss, no rhinorrhea, no sore throat, no abdominal pain, no diarrhea, no vomiting, no cough and no rash. His past medical history does not include hearing loss.    Past Medical History:  Diagnosis Date  . Broken finger   . Scoliosis     Patient Active Problem List   Diagnosis Date Noted  . Sports physical 02/19/2011    Past Surgical History:  Procedure Laterality Date  . KNEE SURGERY    . tumor     benign tumor r/thigh        Home Medications    Prior to Admission medications   Medication Sig Start Date End Date Taking? Authorizing Provider  acyclovir (ZOVIRAX) 400 MG tablet Take 1 tablet (400 mg total) by mouth 3 (three) times daily. Patient not taking: Reported on 11/29/2017 05/31/17   Eyvonne Mechanic, PA-C  cyclobenzaprine (FLEXERIL) 10 MG tablet Take 1 tablet (10 mg total) by mouth 2 (two) times daily as needed for muscle spasms. Patient not taking: Reported on 11/29/2017 05/14/16   Roxy Horseman, PA-C  cyclobenzaprine (FLEXERIL) 10 MG tablet Take 1 tablet (10 mg total) by mouth 2 (two) times daily as needed for muscle spasms. 01/10/18   Kellie Shropshire, PA-C  ibuprofen (ADVIL,MOTRIN) 400 MG tablet Take 1 tablet (400 mg total) by mouth every 6 (six) hours as needed for pain. 01/30/12   Ivonne Andrew, PA-C    methocarbamol (ROBAXIN) 500 MG tablet Take 1 tablet (500 mg total) by mouth at bedtime as needed for muscle spasms. Patient not taking: Reported on 11/29/2017 12/17/16   Mathews Robinsons B, PA-C  naproxen (NAPROSYN) 500 MG tablet Take 1 tablet (500 mg total) by mouth 2 (two) times daily. 11/29/17   Jaynie Crumble, PA-C    Family History Family History  Problem Relation Age of Onset  . Sudden death Neg Hx   . Heart attack Neg Hx     Social History Social History   Tobacco Use  . Smoking status: Never Smoker  . Smokeless tobacco: Never Used  Substance Use Topics  . Alcohol use: No  . Drug use: No     Allergies   Patient has no known allergies.   Review of Systems Review of Systems  Constitutional: Negative for chills and fever.  HENT: Positive for ear pain. Negative for ear discharge, hearing loss, rhinorrhea and sore throat.   Eyes: Negative for pain and visual disturbance.  Respiratory: Negative for cough and shortness of breath.   Cardiovascular: Negative for chest pain and palpitations.  Gastrointestinal: Negative for abdominal pain, diarrhea and vomiting.  Genitourinary: Negative for dysuria and hematuria.  Musculoskeletal: Negative for arthralgias and back pain.  Skin: Negative for color change and rash.  Neurological: Negative for seizures, syncope and headaches.  All other systems reviewed and are negative.    Physical Exam Updated Vital Signs BP 124/68 (BP Location: Right Arm)   Pulse 67   Temp 98.5 F (36.9 C) (Oral)   Resp 16   Ht 5\' 10"  (1.778 m)   Wt 72.6 kg   SpO2 100%   BMI 22.96 kg/m   Physical Exam  Constitutional: He appears well-developed and well-nourished.  HENT:  Head: Normocephalic and atraumatic.  Right Ear: Hearing, tympanic membrane and external ear normal. No swelling or tenderness. Tympanic membrane is not injected and not perforated. No middle ear effusion.  Left Ear: Hearing, external ear and ear canal normal. No swelling or  tenderness. Tympanic membrane is not injected and not perforated.  No middle ear effusion.  Mild ear wax in right ear  Eyes: Conjunctivae are normal.  Neck: Neck supple.  Cardiovascular: Normal rate and regular rhythm.  No murmur heard. Pulmonary/Chest: Effort normal and breath sounds normal. No respiratory distress.  Abdominal: Soft. There is no tenderness.  Musculoskeletal: He exhibits no edema.  Neurological: He is alert.  Skin: Skin is warm and dry.  Psychiatric: He has a normal mood and affect.  Nursing note and vitals reviewed.    ED Treatments / Results  Labs (all labs ordered are listed, but only abnormal results are displayed) Labs Reviewed - No data to display  EKG None  Radiology No results found.  Procedures Procedures (including critical care time)  Medications Ordered in ED Medications - No data to display   Initial Impression / Assessment and Plan / ED Course  I have reviewed the triage vital signs and the nursing notes.  Pertinent labs & imaging results that were available during my care of the patient were reviewed by me and considered in my medical decision making (see chart for details).     Jonathan Rice is a 22 year old male with no significant medical history who presents to the ED with right ear pain.  Patient with normal vitals.  No fever.  Patient with mild earwax in the right ear.  No signs of infection.  Tympanic membranes are intact.  Patient with no tenderness behind the ear.  No concern for mastoiditis or otitis.  Symptoms are secondary from earwax.  Educated him on how to safely irrigate his ears at home.  Discharged from ED in good condition.  Told to return to the ED symptoms worsen.  Final Clinical Impressions(s) / ED Diagnoses   Final diagnoses:  Impacted cerumen of right ear    ED Discharge Orders    None       Virgina Norfolk, DO 02/12/18 [phone]

## 2018-02-12 NOTE — ED Triage Notes (Signed)
Patient here from home with complaints of right ear pain that started 2 hours ago. States that it feels like something is in it.

## 2018-03-08 ENCOUNTER — Encounter (HOSPITAL_COMMUNITY): Payer: Self-pay | Admitting: Emergency Medicine

## 2018-03-08 ENCOUNTER — Emergency Department (HOSPITAL_COMMUNITY)
Admission: EM | Admit: 2018-03-08 | Discharge: 2018-03-08 | Disposition: A | Discharge status: Home / Self Care | Payer: Medicaid Other | Attending: Emergency Medicine | Admitting: Emergency Medicine

## 2018-03-08 DIAGNOSIS — M542 Cervicalgia: Secondary | ICD-10-CM | POA: Insufficient documentation

## 2018-03-08 MED ORDER — METHOCARBAMOL 500 MG PO TABS: 500.0000 mg | ORAL_TABLET | Freq: Three times a day (TID) | ORAL | 0 refills | Status: DC | PRN

## 2018-03-08 MED ORDER — NAPROXEN 500 MG PO TABS: 500.0000 mg | ORAL_TABLET | Freq: Two times a day (BID) | ORAL | 0 refills | Status: DC

## 2018-03-08 NOTE — ED Triage Notes (Signed)
Pt reports recurrent low back and l/side pain. Pain has increased over last few days, Pain is unresponsive to Motrin

## 2018-03-08 NOTE — Discharge Instructions (Addendum)
You were seen in the emergency department for neck/back pain today.  At this time we suspect that your pain is related to a muscle strain/spasm.   I have prescribed you an anti-inflammatory medication and a muscle relaxer.  - Naproxen is a nonsteroidal anti-inflammatory medication that will help with pain and swelling. Be sure to take this medication as prescribed with food, 1 pill every 12 hours,  It should be taken with food, as it can cause stomach upset, and more seriously, stomach bleeding. Do not take other nonsteroidal anti-inflammatory medications with this such as Advil, Motrin, Aleve, Mobic, Goodie Powder, or Motrin.    - Robaxin is the muscle relaxer I have prescribed, this is meant to help with muscle tightness. Be aware that this medication may make you drowsy therefore the first time you take this it should be at a time you are in an environment where you can rest. Do not drive or operate heavy machinery when taking this medication. Do not drink alcohol or take other sedating medications with this medicine such as narcotics or benzodiazepines.   You make take Tylenol per over the counter dosing with these medications.   We have prescribed you new medication(s) today. Discuss the medications prescribed today with your pharmacist as they can have adverse effects and interactions with your other medicines including over the counter and prescribed medications. Seek medical evaluation if you start to experience new or abnormal symptoms after taking one of these medicines, seek care immediately if you start to experience difficulty breathing, feeling of your throat closing, facial swelling, or rash as these could be indications of a more serious allergic reaction  The application of heat can help soothe the pain.  Maintaining your daily activities, including walking, is encourged, as it will help you get better faster than just staying in bed.  Your pain should get better over the next 2 weeks.   You will need to follow up with  Your primary healthcare provider in 1-2 weeks for reassessment, if you do not have a primary care provider one is provided in your discharge instructions- you may see the [facility] or call the provided phone number. However return to the ER should you develop ne or worsening symptoms or any other concerns including but not limited to severe or worsening pain, low back pain with fever, numbness, weakness, loss of bowel or bladder control, or inability to walk or urinate, you should return to the ER immediately.     Additionally have your blood pressure rechecked by primary care as this is elevated in the ER today.  Vitals:   03/08/18 1447  BP: (!) 144/73  Pulse: (!) 55  Resp: 18  Temp: 98.5 F (36.9 C)  SpO2: 99%

## 2018-03-08 NOTE — ED Provider Notes (Signed)
[facility]PT Provider Note   CSN: [id number] Arrival date & time: 03/08/18  1443     History   Chief Complaint Chief Complaint  Patient presents with  . Back Pain    HPI Jonathan Rice is a 22 y.o. male with a hx of scoliosis who presents to the ED with complaints of neck/back pain which has been intermittently occurring since [facility] 12/2017. Patient states pain has flared over past 3-4 days. States pain is mostly to the bilateral neck and upper back and occurs specifically with movement, at rest no significant discomfort. Pain at worst is an 8/10 in severity. He has tried motrin without relief. No recent traumatic injuries. No recent change in activity. Denies numbness, tingling, weakness, saddle anesthesia, incontinence to bowel/bladder, fever, chills, IV drug use, dysuria, or hx of cancer. Patient has not had prior neck/back surgeries.   HPI  Past Medical History:  Diagnosis Date  . Broken finger   . Scoliosis     Patient Active Problem List   Diagnosis Date Noted  . Sports physical 02/19/2011    Past Surgical History:  Procedure Laterality Date  . KNEE SURGERY    . tumor     benign tumor r/thigh        Home Medications    Prior to Admission medications   Medication Sig Start Date End Date Taking? Authorizing Provider  acyclovir (ZOVIRAX) 400 MG tablet Take 1 tablet (400 mg total) by mouth 3 (three) times daily. Patient not taking: Reported on 11/29/2017 05/31/17   Eyvonne Mechanic, PA-C  cyclobenzaprine (FLEXERIL) 10 MG tablet Take 1 tablet (10 mg total) by mouth 2 (two) times daily as needed for muscle spasms. Patient not taking: Reported on 11/29/2017 05/14/16   Roxy Horseman, PA-C  cyclobenzaprine (FLEXERIL) 10 MG tablet Take 1 tablet (10 mg total) by mouth 2 (two) times daily as needed for muscle spasms. 01/10/18   Kellie Shropshire, PA-C  ibuprofen (ADVIL,MOTRIN) 400 MG tablet Take 1 tablet (400 mg total) by mouth every 6 (six)  hours as needed for pain. 01/30/12   Ivonne Andrew, PA-C  methocarbamol (ROBAXIN) 500 MG tablet Take 1 tablet (500 mg total) by mouth at bedtime as needed for muscle spasms. Patient not taking: Reported on 11/29/2017 12/17/16   Mathews Robinsons B, PA-C  naproxen (NAPROSYN) 500 MG tablet Take 1 tablet (500 mg total) by mouth 2 (two) times daily. 11/29/17   Jaynie Crumble, PA-C    Family History Family History  Problem Relation Age of Onset  . Sudden death Neg Hx   . Heart attack Neg Hx     Social History Social History   Tobacco Use  . Smoking status: Never Smoker  . Smokeless tobacco: Never Used  Substance Use Topics  . Alcohol use: No  . Drug use: No     Allergies   Patient has no known allergies.   Review of Systems Review of Systems  Constitutional: Negative for chills and fever.  Gastrointestinal: Negative for abdominal pain.  Genitourinary: Negative for dysuria.  Musculoskeletal: Positive for back pain and neck pain.  Neurological: Negative for weakness and numbness.       Negative for incontinence or saddle anesthesia.      Physical Exam Updated Vital Signs BP (!) 144/73 (BP Location: Right Arm)   Pulse (!) 55   Temp 98.5 F (36.9 C) (Oral)   Resp 18   SpO2 99%   Physical Exam  Constitutional: He appears well-developed and  well-nourished. No distress.  HENT:  Head: Normocephalic and atraumatic.  Eyes: Conjunctivae are normal. Right eye exhibits no discharge. Left eye exhibits no discharge.  Neck: Normal range of motion.  Abdominal: Soft. He exhibits no distension. There is no tenderness.  Musculoskeletal:  No appreciable swelling, erythema, ecchymosis, or warmth. Neck/back: Scoliosis curvature noted.  Patient is without point/focal vertebral tenderness to palpation to the cervical, thoracic, or lumbar spine.  He is tender to palpation over bilateral cervical paraspinal muscle regions including the trapezius muscle bilaterally.  Neurological: He is  alert.  Clear speech.  Sensation grossly intact bilateral upper and lower extremities.  5 out of 5 symmetric grip strength as well as 5 out of 5 symmetric strength with knee flexion/extension and ankle plantar/dorsiflexion.  Patellar DTRs are 2+ and symmetric.  Ambulatory with steady gait.  Psychiatric: He has a normal mood and affect. His behavior is normal. Thought content normal.  Nursing note and vitals reviewed.    ED Treatments / Results  Labs (all labs ordered are listed, but only abnormal results are displayed) Labs Reviewed - No data to display  EKG None  Radiology No results found.  Procedures Procedures (including critical care time)  Medications Ordered in ED Medications - No data to display   Initial Impression / Assessment and Plan / ED Course  I have reviewed the triage vital signs and the nursing notes.  Pertinent labs & imaging results that were available during my care of the patient were reviewed by me and considered in my medical decision making (see chart for details).    Patient presents with complaint of neck/back pain.  Patient is nontoxic appearing, vitals without significant abnormality, elevated BP- doubt HTN emergency. Patient has normal neurologic exam, no point/focal midline tenderness to palpation. He is ambulatory in the ED.  No back pain red flags. No urinary sxs. Most likely muscle strain versus spasm. Considered disc disease, aortic aneurysm/dissection, cauda equina, meningitis, or epidural abscess however these do not fit clinical picture at this time. Will treat with Naproxen and Robaxin, discussed with patient that they are not to drive or operate heavy machinery while taking Robaxin. I discussed treatment plan, need for PCP follow-up, and return precautions with the patient. Provided opportunity for questions, patient confirmed understanding and is in agreement with plan.   Final Clinical Impressions(s) / ED Diagnoses   Final diagnoses:  Neck  pain    ED Discharge Orders         Ordered    methocarbamol (ROBAXIN) 500 MG tablet  Every 8 hours PRN     03/08/18 1509    naproxen (NAPROSYN) 500 MG tablet  2 times daily     03/08/18 1509           Kolson Chovanec, GrandviewSamantha R, PA-C 03/08/18 1511    Jacalyn LefevreHaviland, Julie, MD 03/08/18 1514

## 2018-12-06 IMAGING — DX DG CHEST 2V
2 series · 2 of 2 positions shown · non-contrast
Comparison: None.

CLINICAL DATA: Right upper chest pain this evening with exertion.

EXAM:
CHEST - 2 VIEW

[chest pa]
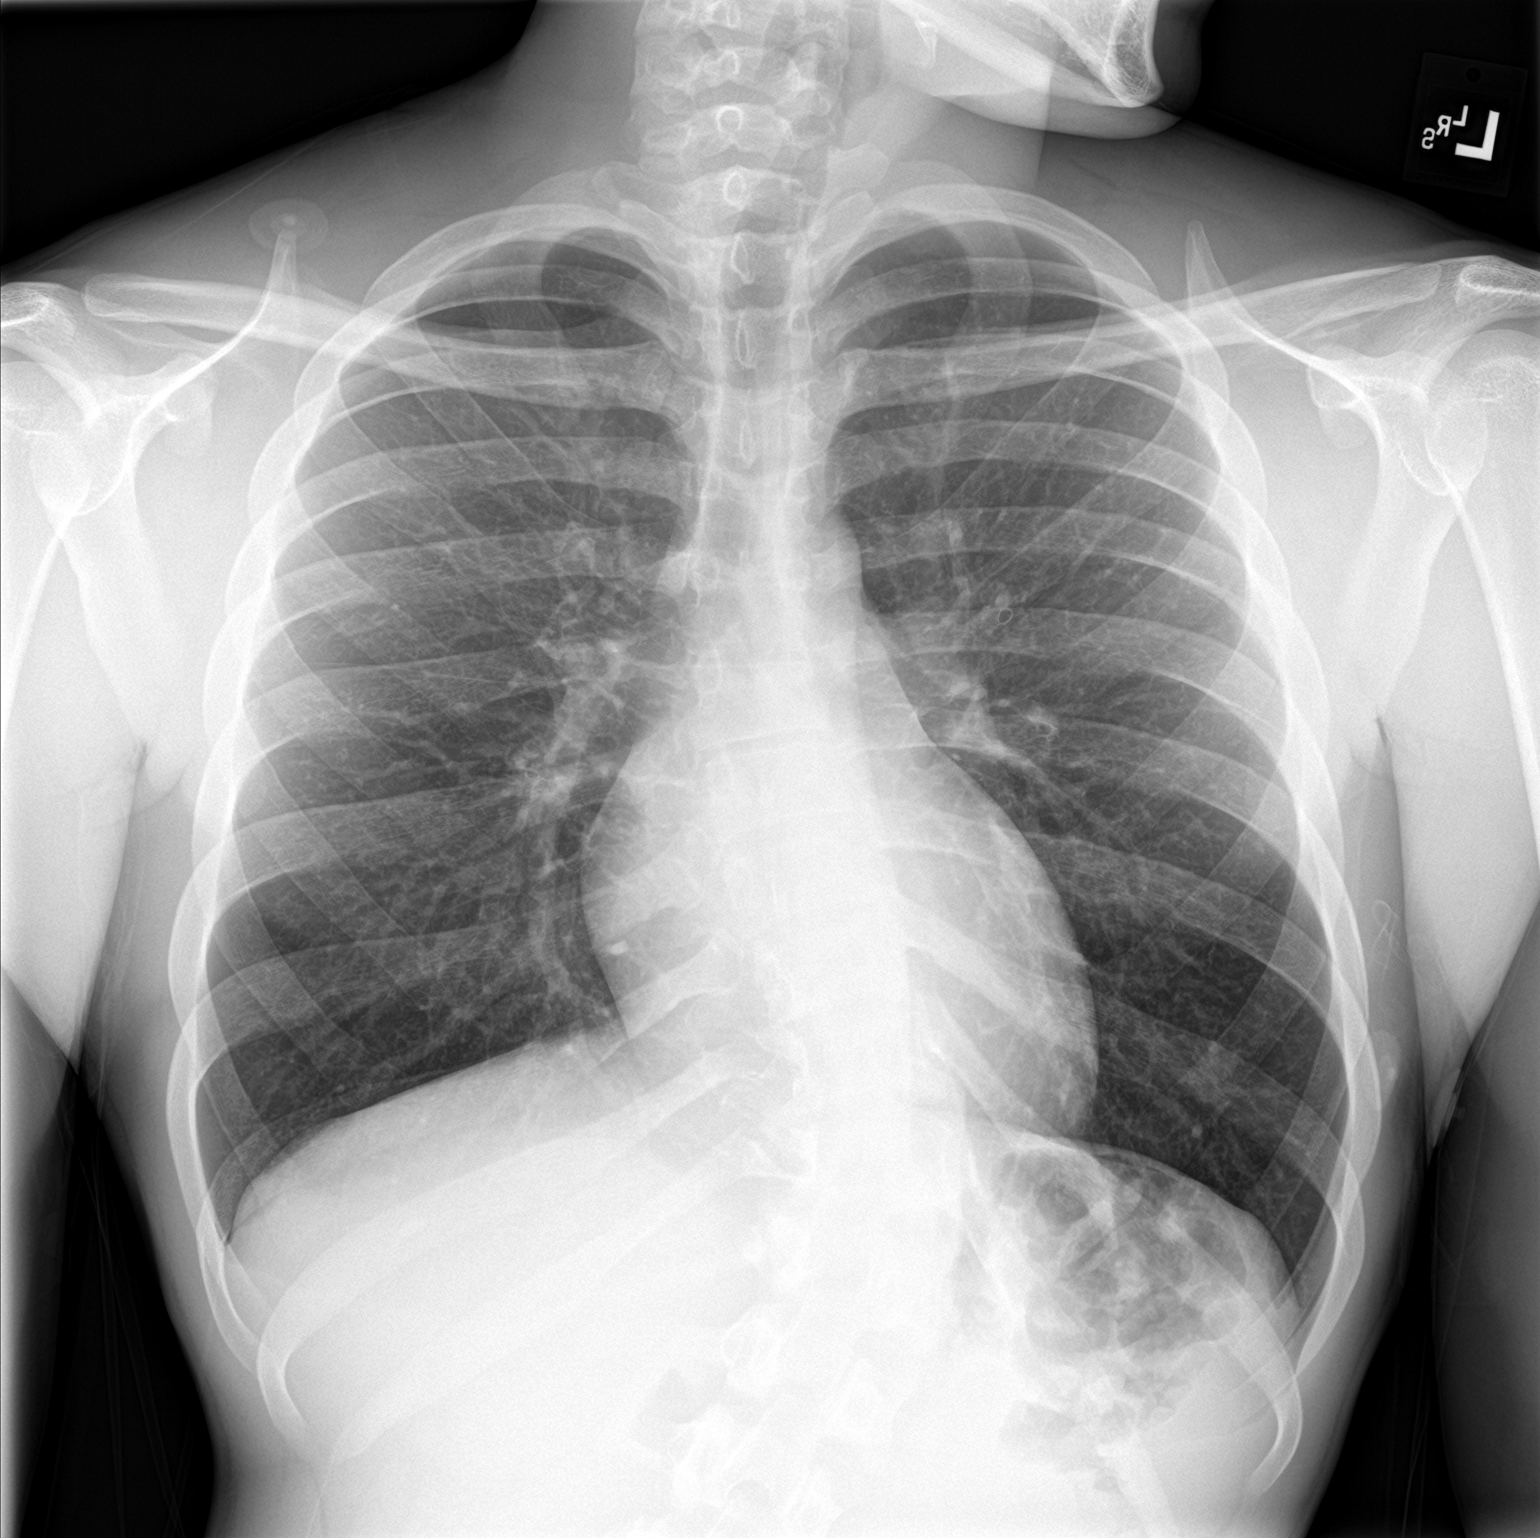

[chest lat]
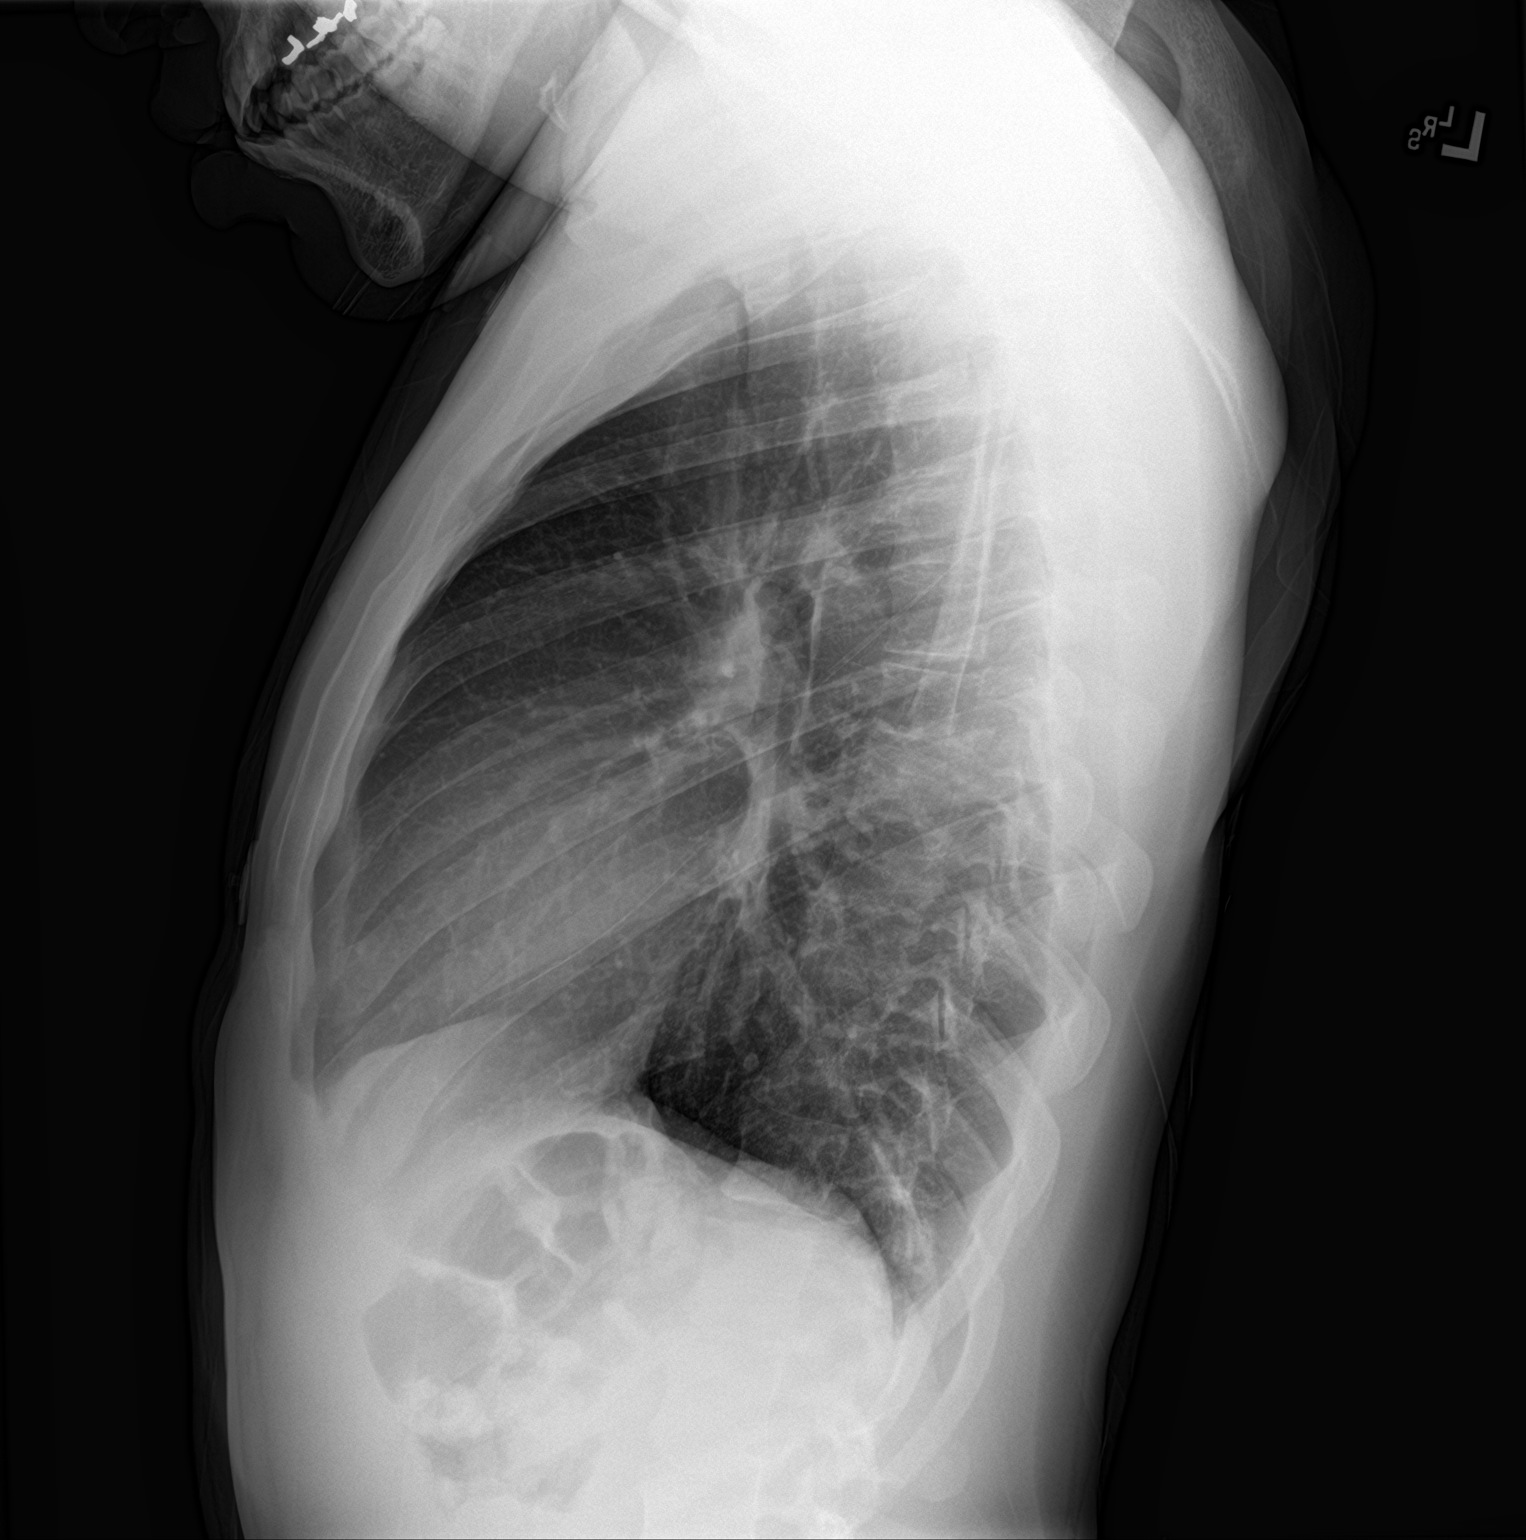

[2 of 2 positions shown; findings below may reference images not displayed]

FINDINGS: The heart size and mediastinal contours are within normal limits.
Both lungs are clear. The visualized skeletal structures are
unremarkable.
IMPRESSION: No active cardiopulmonary disease.

## 2018-12-28 ENCOUNTER — Encounter (HOSPITAL_COMMUNITY): Payer: Self-pay

## 2018-12-28 ENCOUNTER — Other Ambulatory Visit: Payer: Self-pay

## 2018-12-28 ENCOUNTER — Emergency Department (HOSPITAL_COMMUNITY)
Admission: EM | Admit: 2018-12-28 | Discharge: 2018-12-28 | Disposition: A | Discharge status: Home / Self Care | Payer: Medicaid Other | Attending: Emergency Medicine | Admitting: Emergency Medicine

## 2018-12-28 DIAGNOSIS — K0889 Other specified disorders of teeth and supporting structures: Secondary | ICD-10-CM | POA: Insufficient documentation

## 2018-12-28 MED ORDER — MELOXICAM 7.5 MG PO TABS: 7.5000 mg | ORAL_TABLET | Freq: Every day | ORAL | 0 refills | Status: AC

## 2018-12-28 MED ORDER — METHOCARBAMOL 500 MG PO TABS: 500.0000 mg | ORAL_TABLET | Freq: Three times a day (TID) | ORAL | 0 refills | Status: DC | PRN

## 2018-12-28 NOTE — Discharge Instructions (Addendum)
Apply Lolicaine to space between teeth and cheek to help with pain. Take Meloxicam as prescribed for pain for pain, do not take other NSAIDS while taking Meloxicam. Follow up with dentist or oral surgery to discuss dental pain and possible tooth extraction.
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
## 2018-12-28 NOTE — ED Triage Notes (Signed)
Pt reports back left lower dental pain. Pt states he think it's related to wisdom teeth. Pt reports taking 2 ibuprofen and using oralgel this morning without any relief.

## 2019-03-07 ENCOUNTER — Encounter (HOSPITAL_COMMUNITY): Payer: Self-pay

## 2019-03-07 ENCOUNTER — Other Ambulatory Visit: Payer: Self-pay

## 2019-03-07 ENCOUNTER — Emergency Department (HOSPITAL_COMMUNITY)
Admission: EM | Admit: 2019-03-07 | Discharge: 2019-03-07 | Disposition: A | Discharge status: Home / Self Care | Payer: Medicaid Other | Attending: Emergency Medicine | Admitting: Emergency Medicine

## 2019-03-07 DIAGNOSIS — Z20828 Contact with and (suspected) exposure to other viral communicable diseases: Secondary | ICD-10-CM | POA: Insufficient documentation

## 2019-03-07 DIAGNOSIS — J069 Acute upper respiratory infection, unspecified: Secondary | ICD-10-CM | POA: Insufficient documentation

## 2019-03-07 DIAGNOSIS — H9202 Otalgia, left ear: Secondary | ICD-10-CM | POA: Insufficient documentation

## 2019-03-07 LAB — SARS CORONAVIRUS 2 (TAT 6-24 HRS): SARS Coronavirus 2: NEGATIVE

## 2019-03-07 LAB — GROUP A STREP BY PCR: Group A Strep by PCR: NOT DETECTED

## 2019-03-07 MED ORDER — IBUPROFEN 800 MG PO TABS
800.0000 mg | ORAL_TABLET | Freq: Once | ORAL | Status: AC
  Administered 2019-03-07: 10:00:00 800 mg via ORAL
  Filled 2019-03-07: qty 1

## 2019-03-07 MED ORDER — LIDOCAINE VISCOUS HCL 2 % MT SOLN
15.0000 mL | Freq: Once | OROMUCOSAL | Status: AC
  Administered 2019-03-07: 15 mL via OROMUCOSAL
  Filled 2019-03-07: qty 15

## 2019-03-07 MED ORDER — FLUTICASONE PROPIONATE 50 MCG/ACT NA SUSP: 1.0000 | Freq: Every day | NASAL | 0 refills | Status: AC

## 2019-03-07 MED ORDER — PHENOL 1.4 % MT LIQD: 1.0000 | OROMUCOSAL | 0 refills | Status: AC | PRN

## 2019-03-07 NOTE — Discharge Instructions (Addendum)
You likely have a viral illness.  This should be treated symptomatically. Use Tylenol or ibuprofen as needed for fevers, sore throat, or body aches. Use Flonase daily for nasal congestion and cough. Use sore throat spray as needed. You were tested for coronavirus today. Results should be back within 24 hours. You can view results on mychart.  Make sure you stay well-hydrated with water. Wash your hands frequently to prevent spread of infection. Follow-up with your primary care doctor in 1 week if your symptoms are not improving. Return to the emergency room if you develop difficulty swallowing you own saliva, inability to open your mouth, chest pain, difficulty breathing, or any new or worsening symptoms.

## 2019-03-07 NOTE — ED Triage Notes (Signed)
Patient c/o sore throat and left ear pain x 2 days.
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
## 2022-01-07 ENCOUNTER — Other Ambulatory Visit: Payer: Self-pay

## 2022-01-07 ENCOUNTER — Emergency Department (HOSPITAL_COMMUNITY)
Admission: EM | Admit: 2022-01-07 | Discharge: 2022-01-08 | Disposition: A | Discharge status: Home / Self Care | Payer: Medicaid Other | Attending: Emergency Medicine | Admitting: Emergency Medicine

## 2022-01-07 ENCOUNTER — Encounter (HOSPITAL_COMMUNITY): Payer: Self-pay

## 2022-01-07 DIAGNOSIS — X500XXA Overexertion from strenuous movement or load, initial encounter: Secondary | ICD-10-CM | POA: Insufficient documentation

## 2022-01-07 DIAGNOSIS — S39012A Strain of muscle, fascia and tendon of lower back, initial encounter: Secondary | ICD-10-CM

## 2022-01-07 NOTE — ED Triage Notes (Signed)
Ambulatory to ED with c/o back pain after helping move a dresser earlier today. Hx of scoliosis. No meds PTA.

## 2022-01-08 MED ORDER — NAPROXEN 500 MG PO TABS: 500.0000 mg | ORAL_TABLET | Freq: Two times a day (BID) | ORAL | 0 refills | Status: AC

## 2022-01-08 MED ORDER — NAPROXEN 500 MG PO TABS
500.0000 mg | ORAL_TABLET | Freq: Once | ORAL | Status: AC
  Administered 2022-01-08: 500 mg via ORAL
  Filled 2022-01-08: qty 1

## 2022-01-08 NOTE — ED Provider Notes (Signed)
[facility]-EMERGENCY DEPT Provider Note   CSN: [id number] Arrival date & time: 01/07/22  2059     History  Chief Complaint  Patient presents with   Back Pain    Jonathan Rice is a 26 y.o. male.  26 year old male presents to the emergency department for evaluation of back pain.  Endorses a history of scoliosis.  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  This has persisted.  He tried to "sleep it off", but when it did not improve he opted to come to the ED for evaluation.  No direct trauma or fall associated with pain today.  Denies bowel or bladder incontinence, genital or perianal numbness, inability to ambulate.  Further denies associated fevers, history of IV drug use.  The history is provided by the patient. No language interpreter was used.  Back Pain      Home Medications Prior to Admission medications   Medication Sig Start Date End Date Taking? Authorizing Provider  naproxen (NAPROSYN) 500 MG tablet Take 1 tablet (500 mg total) by mouth 2 (two) times daily. 01/08/22  Yes Antony Madura, PA-C  fluticasone (FLONASE) 50 MCG/ACT nasal spray Place 1 spray into both nostrils daily. 03/07/19   Caccavale, Sophia, PA-C  phenol (CHLORASEPTIC) 1.4 % LIQD Use as directed 1 spray in the mouth or throat as needed for throat irritation / pain. 03/07/19   Caccavale, Sophia, PA-C      Allergies    Patient has no known allergies.    Review of Systems   Review of Systems  Musculoskeletal:  Positive for back pain.  Ten systems reviewed and are negative for acute change, except as noted in the HPI.    Physical Exam Updated Vital Signs BP (!) 142/76   Pulse 66   Temp 99 F (37.2 C) (Oral)   Resp 18   Ht 5\' 10"  (1.778 m)   Wt 74.8 kg   SpO2 100%   BMI 23.68 kg/m   Physical Exam Vitals and nursing note reviewed.  Constitutional:      General: He is not in acute distress.    Appearance: He is well-developed. He is not  diaphoretic.     Comments: Nontoxic appearing and in NAD  HENT:     Head: Normocephalic and atraumatic.  Eyes:     General: No scleral icterus.    Conjunctiva/sclera: Conjunctivae normal.  Pulmonary:     Effort: Pulmonary effort is normal. No respiratory distress.     Comments: Respirations even and unlabored Musculoskeletal:        General: Normal range of motion.     Cervical back: Normal range of motion.     Comments: No bony deformities, step-offs, crepitus to the thoracic or lumbosacral midline.  There is left lumbar paraspinal tenderness with mild spasm.  Skin:    General: Skin is warm and dry.     Coloration: Skin is not pale.     Findings: No erythema or rash.  Neurological:     Mental Status: He is alert and oriented to person, place, and time.     Sensory: No sensory deficit.     Coordination: Coordination normal.     Comments: Ambulatory with steady gait  Psychiatric:        Behavior: Behavior normal.     ED Results / Procedures / Treatments   Labs (all labs ordered are listed, but only abnormal results are displayed) Labs Reviewed -  No data to display  EKG None  Radiology No results found.  Procedures Procedures    Medications Ordered in ED Medications  naproxen (NAPROSYN) tablet 500 mg (has no administration in time range)    ED Course/ Medical Decision Making/ A&P                           Medical Decision Making Risk Prescription drug management.   This patient presents to the ED for concern of back pain, this involves an extensive number of treatment options, and is a complaint that carries with it a high risk of complications and morbidity.  The differential diagnosis includes sprain/strain vs radiculopathy vs cauda equina   Co morbidities that complicate the patient evaluation  Scoliosis    Medicines ordered and prescription drug management:  I ordered medication including Naproxen for pain  Reevaluation of the patient after these  medicines showed that the patient stayed the same I have reviewed the patients home medicines and have made adjustments as needed   Test Considered:  DG lumbar spine   Problem List / ED Course:  Patient neurovascularly intact on exam.  Ambulatory without difficulty. No red flags or signs concerning for cauda equina.   Reevaluation:  After the interventions noted above, I reevaluated the patient and found that they have : remained stable   Social Determinants of Health:  Insured patient   Dispostion:  After consideration of the diagnostic results and the patients response to treatment, I feel that the patent would benefit from supportive measures including RICE, NSAIDs. Encouraged PCP follow up for recheck. Return precautions discussed and provided. Patient discharged in stable condition with no unaddressed concerns.          Final Clinical Impression(s) / ED Diagnoses Final diagnoses:  Strain of lumbar region, initial encounter    Rx / DC Orders ED Discharge Orders          Ordered    naproxen (NAPROSYN) 500 MG tablet  2 times daily        01/08/22 0000              Antony Madura, PA-C 01/08/22 0012    Gilda Crease, MD 01/08/22 [id number]

## 2022-01-08 NOTE — Discharge Instructions (Addendum)
Alternate ice and heat to areas of injury 3-4 times per day to limit inflammation and spasm.  Avoid strenuous activity and heavy lifting.  We recommend consistent use of naproxen for pain.  Follow-up with a primary care doctor to ensure resolution of symptoms.  Return to the ED for any new or concerning symptoms.
